# Patient Record
Sex: Female | Born: 1990 | Race: White | Hispanic: No | Marital: Single | Smoking: Never smoker
Health system: Southern US, Community
[De-identification: ages and names within clinical notes are randomized; demographics above are authoritative.]

## PROBLEM LIST (undated history)

## (undated) DIAGNOSIS — E282 Polycystic ovarian syndrome: Secondary | ICD-10-CM

## (undated) HISTORY — DX: Polycystic ovarian syndrome: E28.2

---
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
## 2011-10-17 HISTORY — PX: TONSILLECTOMY: SUR1361

## 2015-07-20 ENCOUNTER — Emergency Department (INDEPENDENT_AMBULATORY_CARE_PROVIDER_SITE_OTHER)
Admission: EM | Admit: 2015-07-20 | Discharge: 2015-07-20 | Disposition: A | Discharge status: Home / Self Care | Payer: BC Managed Care – PPO | Source: Home / Self Care | Attending: Emergency Medicine | Admitting: Emergency Medicine

## 2015-07-20 ENCOUNTER — Encounter (HOSPITAL_COMMUNITY): Payer: Self-pay | Admitting: Emergency Medicine

## 2015-07-20 DIAGNOSIS — S61412A Laceration without foreign body of left hand, initial encounter: Secondary | ICD-10-CM

## 2015-07-20 NOTE — Discharge Instructions (Signed)
Stitches, Staples, or Adhesive Wound Closure For any signs of infection seek medical treatment promptly Health care providers use stitches (sutures), staples, and certain glue (skin adhesives) to hold skin together while it heals (wound closure). You may need this treatment after you have surgery or if you cut your skin accidentally. These methods help your skin to heal more quickly and make it less likely that you will have a scar. A wound may take several months to heal completely. The type of wound you have determines when your wound gets closed. In most cases, the wound is closed as soon as possible (primary skin closure). Sometimes, closure is delayed so the wound can be cleaned and allowed to heal naturally. This reduces the chance of infection. Delayed closure may be needed if your wound:  Is caused by a bite.  Happened more than 6 hours ago.  Involves loss of skin or the tissues under the skin.  Has dirt or debris in it that cannot be removed.  Is infected. WHAT ARE THE DIFFERENT KINDS OF WOUND CLOSURES? There are many options for wound closure. The one that your health care provider uses depends on how deep and how large your wound is. Adhesive Glue############## To use this type of glue to close a wound, your health care provider holds the edges of the wound together and paints the glue on the surface of your skin. You may need more than one layer of glue. Then the wound may be covered with a light bandage (dressing). This type of skin closure may be used for small wounds that are not deep (superficial). Using glue for wound closure is less painful than other methods. It does not require a medicine that numbs the area (local anesthetic). This method also leaves nothing to be removed. Adhesive glue is often used for children and on facial wounds. Adhesive glue cannot be used for wounds that are deep, uneven, or bleeding. It is not used inside of a wound.  Adhesive Strips These strips  are made of sticky (adhesive), porous paper. They are applied across your skin edges like a regular adhesive bandage. You leave them on until they fall off. Adhesive strips may be used to close very superficial wounds. They may also be used along with sutures to improve the closure of your skin edges.  Sutures Sutures are the oldest method of wound closure. Sutures can be made from natural substances, such as silk, or from synthetic materials, such as nylon and steel. They can be made from a material that your body can break down as your wound heals (absorbable), or they can be made from a material that needs to be removed from your skin (nonabsorbable). They come in many different strengths and sizes. Your health care provider attaches the sutures to a steel needle on one end. Sutures can be passed through your skin, or through the tissues beneath your skin. Then they are tied and cut. Your skin edges may be closed in one continuous stitch or in separate stitches. Sutures are strong and can be used for all kinds of wounds. Absorbable sutures may be used to close tissues under the skin. The disadvantage of sutures is that they may cause skin reactions that lead to infection. Nonabsorbable sutures need to be removed. Staples When surgical staples are used to close a wound, the edges of your skin on both sides of the wound are brought close together. A staple is placed across the wound, and an instrument secures the edges  together. Staples are often used to close surgical cuts (incisions). Staples are faster to use than sutures, and they cause less skin reaction. Staples need to be removed using a tool that bends the staples away from your skin. HOW DO I CARE FOR MY WOUND CLOSURE?  Take medicines only as directed by your health care provider.  If you were prescribed an antibiotic medicine for your wound, finish it all even if you start to feel better.  Use ointments or creams only as directed by your  health care provider.  Wash your hands with soap and water before and after touching your wound.  Do not soak your wound in water. Do not take baths, swim, or use a hot tub until your health care provider approves.  Ask your health care provider when you can start showering. Cover your wound if directed by your health care provider.  Do not take out your own sutures or staples.  Do not pick at your wound. Picking can cause an infection.  Keep all follow-up visits as directed by your health care provider. This is important. HOW LONG WILL I HAVE MY WOUND CLOSURE?  Leave adhesive glue on your skin until the glue peels away.  Leave adhesive strips on your skin until the strips fall off.  Absorbable sutures will dissolve within several days.  Nonabsorbable sutures and staples must be removed. The location of the wound will determine how long they stay in. This can range from several days to a couple of weeks. WHEN SHOULD I SEEK HELP FOR MY WOUND CLOSURE? Contact your health care provider if:  You have a fever.  You have chills.  You have drainage, redness, swelling, or pain at your wound.  There is a bad smell coming from your wound.  The skin edges of your wound start to separate after your sutures have been removed.  Your wound becomes thick, raised, and darker in color after your sutures come out (scarring).   This information is not intended to replace advice given to you by your health care provider. Make sure you discuss any questions you have with your health care provider.   Document Released: 06/27/2001 Document Revised: 10/23/2014 Document Reviewed: 03/11/2014 Elsevier Interactive Patient Education [employer]

## 2015-07-20 NOTE — ED Notes (Signed)
Pt reports laceration to left palm onset 1845 while carving pumpkins Bleeding controlled; last tetanus = unknown A&O x4... No acute distress.

## 2015-07-20 NOTE — ED Provider Notes (Signed)
CSN: [id number]     Arrival date & time 07/20/15  1919 History   First MD Initiated Contact with Patient 07/20/15 1940     Chief Complaint  Patient presents with  . Extremity Laceration   (Consider location/radiation/quality/duration/timing/severity/associated sxs/prior Treatment) HPI Comments: 24 year old female was using a knife to carve a pumpkin this evening at 645 when the knife slipped and produced a superficial puncture wound to the palm of the left hand.   History reviewed. No pertinent past medical history. History reviewed. No pertinent past surgical history. No family history on file. Social History  Substance Use Topics  . Smoking status: Never Smoker   . Smokeless tobacco: None  . Alcohol Use: Yes   OB History    No data available     Review of Systems  Constitutional: Negative.   Skin: Positive for wound.  All other systems reviewed and are negative.   Allergies  Review of patient's allergies indicates no known allergies.  Home Medications   Prior to Admission medications   Not on File   Meds Ordered and Administered this Visit  Medications - No data to display  BP 125/77 mmHg  Pulse 82  Temp(Src) 99 F (37.2 C) (Oral)  Resp 16  SpO2 100%  LMP 07/01/2015 No data found.   Physical Exam  Constitutional: She is oriented to person, place, and time. She appears well-developed and well-nourished. No distress.  Eyes: EOM are normal.  Neck: Normal range of motion. Neck supple.  Cardiovascular: Normal rate.   Pulmonary/Chest: Effort normal. No respiratory distress.  Musculoskeletal: She exhibits no edema.  Neurological: She is alert and oriented to person, place, and time. She exhibits normal muscle tone.  Skin: Skin is warm and dry.  4 mm puncture wound/superficial laceration involving the dermis only to the palm of the left hand near the interdigital webspace between the thumb and index finger. No current bleeding.  Psychiatric: She has a normal  mood and affect.  Nursing note and vitals reviewed.   ED Course  LACERATION REPAIR Date/Time: 07/20/2015 8:16 PM Performed by: Phineas Real, Harshan Kearley Authorized by: Charm Rings Consent: Verbal consent obtained. Risks and benefits: risks, benefits and alternatives were discussed Consent given by: patient Patient understanding: patient states understanding of the procedure being performed Patient identity confirmed: verbally with patient Body area: upper extremity Location details: left hand Laceration length: 0.4 cm Foreign bodies: no foreign bodies Tendon involvement: none Nerve involvement: none Vascular damage: no Patient sedated: no Irrigation solution: saline Irrigation method: jet lavage Amount of cleaning: standard Debridement: none Degree of undermining: none Skin closure: glue Technique: simple Approximation: close Approximation difficulty: simple Patient tolerance: Patient tolerated the procedure well with no immediate complications   (including critical care time)  Labs Review Labs Reviewed - No data to display  Imaging Review No results found.   Visual Acuity Review  Right Eye Distance:   Left Eye Distance:   Bilateral Distance:    Right Eye Near:   Left Eye Near:    Bilateral Near:         MDM   1. Laceration of left palm without complication, initial encounter    Covered superficial laceration with Dermabond. Instructions for care and infection symptoms discuss with patient and given written instructions.    Hayden Rasmussen, NP 07/20/15 2017

## 2018-03-14 ENCOUNTER — Encounter: Payer: Self-pay | Admitting: Physician Assistant

## 2018-03-14 ENCOUNTER — Ambulatory Visit (INDEPENDENT_AMBULATORY_CARE_PROVIDER_SITE_OTHER): Payer: Self-pay | Admitting: Physician Assistant

## 2018-03-14 ENCOUNTER — Ambulatory Visit: Payer: Self-pay

## 2018-03-14 VITALS — BP 112/72 | HR 83 | Temp 98.1°F | Resp 16 | Ht 70.0 in | Wt 163.0 lb

## 2018-03-14 DIAGNOSIS — R42 Dizziness and giddiness: Secondary | ICD-10-CM

## 2018-03-14 MED ORDER — MECLIZINE HCL 25 MG PO TABS: 25.0000 mg | ORAL_TABLET | Freq: Three times a day (TID) | ORAL | 0 refills | Status: AC | PRN
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
## 2018-03-14 NOTE — Patient Instructions (Addendum)
Vertigo Vertigo is the feeling that you or your surroundings are moving when they are not. Vertigo can be dangerous if it occurs while you are doing something that could endanger you or others, such as driving. What are the causes? This condition is caused by a disturbance in the signals that are sent by your body's sensory systems to your brain. Different causes of a disturbance can lead to vertigo, including:  Infections, especially in the inner ear.  A bad reaction to a drug, or misuse of alcohol and medicines.  Withdrawal from drugs or alcohol.  Quickly changing positions, as when lying down or rolling over in bed.  Migraine headaches.  Decreased blood flow to the brain.  Decreased blood pressure.  Increased pressure in the brain from a head or neck injury, stroke, infection, tumor, or bleeding.  Central nervous system disorders.  What are the signs or symptoms? Symptoms of this condition usually occur when you move your head or your eyes in different directions. Symptoms may start suddenly, and they usually last for less than a minute. Symptoms may include:  Loss of balance and falling.  Feeling like you are spinning or moving.  Feeling like your surroundings are spinning or moving.  Nausea and vomiting.  Blurred vision or double vision.  Difficulty hearing.  Slurred speech.  Dizziness.  Involuntary eye movement (nystagmus).  Symptoms can be mild and cause only slight annoyance, or they can be severe and interfere with daily life. Episodes of vertigo may return (recur) over time, and they are often triggered by certain movements. Symptoms may improve over time. How is this diagnosed? This condition may be diagnosed based on medical history and the quality of your nystagmus. Your health care provider may test your eye movements by asking you to quickly change positions to trigger the nystagmus. This may be called the Dix-Hallpike test, head thrust test, or roll  test. You may be referred to a health care provider who specializes in ear, nose, and throat (ENT) problems (otolaryngologist) or a provider who specializes in disorders of the central nervous system (neurologist). You may have additional testing, including:  A physical exam.  Blood tests.  MRI.  A CT scan.  An electrocardiogram (ECG). This records electrical activity in your heart.  An electroencephalogram (EEG). This records electrical activity in your brain.  Hearing tests.  How is this treated? Treatment for this condition depends on the cause and the severity of the symptoms. Treatment options include:  Medicines to treat nausea or vertigo. These are usually used for severe cases. Some medicines that are used to treat other conditions may also reduce or eliminate vertigo symptoms. These include: ? Medicines that control allergies (antihistamines). ? Medicines that control seizures (anticonvulsants). ? Medicines that relieve depression (antidepressants). ? Medicines that relieve anxiety (sedatives).  Head movements to adjust your inner ear back to normal. If your vertigo is caused by an ear problem, your health care provider may recommend certain movements to correct the problem.  Surgery. This is rare.  Follow these instructions at home: Safety  Move slowly.Avoid sudden body or head movements.  Avoid driving.  Avoid operating heavy machinery.  Avoid doing any tasks that would cause danger to you or others if you would have a vertigo episode during the task.  If you have trouble walking or keeping your balance, try using a cane for stability. If you feel dizzy or unstable, sit down right away.  Return to your normal activities as told by your  health care provider. Ask your health care provider what activities are safe for you. General instructions  Take over-the-counter and prescription medicines only as told by your health care provider.  Avoid certain positions  or movements as told by your health care provider.  Drink enough fluid to keep your urine clear or pale yellow.  Keep all follow-up visits as told by your health care provider. This is important. Contact a health care provider if:  Your medicines do not relieve your vertigo or they make it worse.  You have a fever.  Your condition gets worse or you develop new symptoms.  Your family or friends notice any behavioral changes.  Your nausea or vomiting gets worse.  You have numbness or a "pins and needles" sensation in part of your body. Get help right away if:  You have difficulty moving or speaking.  You are always dizzy.  You faint.  You develop severe headaches.  You have weakness in your hands, arms, or legs.  You have changes in your hearing or vision.  You develop a stiff neck.  You develop sensitivity to light. This information is not intended to replace advice given to you by your health care provider. Make sure you discuss any questions you have with your health care provider. Document Released: 07/12/2005 Document Revised: 03/15/2016 Document Reviewed: 01/25/2015 Elsevier Interactive Patient Education  2018 [employer].    IF you received an x-ray today, you will receive an invoice from [facility]. Please contact [facility] at [phone] with questions or concerns regarding your invoice.   IF you received labwork today, you will receive an invoice from [place]. Please contact [facility] at [phone] with questions or concerns regarding your invoice.   Our billing staff will not be able to assist you with questions regarding bills from these companies.  You will be contacted with the lab results as soon as they are available. The fastest way to get your results is to activate your My Chart account. Instructions are located on the last page of this paperwork. If you have not heard from [place] regarding the results in 2 weeks, please contact  this office.

## 2018-03-14 NOTE — Telephone Encounter (Signed)
Patient called in with c/o "dizziness." She says "the first time it happened was Monday night before bed. I was sitting in a chair and the room started spinning, then I had a headache. I went to bed and woke up fine. It happened again Tuesday night while sitting and again this morning while sitting. Afterwards I feel a little weak when I stand and a little unsteady, but able to walk around without holding on." I asked about other symptoms, she says "headache afterwards is the only thing." According to protocol, see PCP within 24 hours, appointment scheduled for today at 1040 with Benny Lennert, PA-C, care advice given, patient verbalized understanding.   Reason for Disposition . [1] MODERATE dizziness (e.g., vertigo; feels very unsteady, interferes with normal activities) AND [2] has NOT been evaluated by physician for this  Answer Assessment - Initial Assessment Questions 1. DESCRIPTION: "Describe your dizziness."     Room spinning and dizzy with headache 2. VERTIGO: "Do you feel like either you or the room is spinning or tilting?"      Spinning 3. LIGHTHEADED: "Do you feel lightheaded?" (e.g., somewhat faint, woozy, weak upon standing)     A little weak 4. SEVERITY: "How bad is it?"  "Can you walk?"   - MILD - Feels unsteady but walking normally.   - MODERATE - Feels very unsteady when walking, but not falling; interferes with normal activities (e.g., school, work) .   - SEVERE - Unable to walk without falling (requires assistance).     Mild 5. ONSET:  "When did the dizziness begin?"     Monday night, Tuesday night both near bedtime, then this morning 6. AGGRAVATING FACTORS: "Does anything make it worse?" (e.g., standing, change in head position)     Looking at my phone 7. CAUSE: "What do you think is causing the dizziness?"     I don't know 8. RECURRENT SYMPTOM: "Have you had dizziness before?" If so, ask: "When was the last time?" "What happened that time?"     No 9. OTHER SYMPTOMS: "Do  you have any other symptoms?" (e.g., headache, weakness, numbness, vomiting, earache)     Headache after the room spins 10. PREGNANCY: "Is there any chance you are pregnant?" "When was your last menstrual period?"       No-LMP end of April  Protocols used: DIZZINESS - VERTIGO-A-AH

## 2018-05-10 ENCOUNTER — Other Ambulatory Visit: Payer: Self-pay

## 2018-05-10 ENCOUNTER — Encounter: Payer: Self-pay | Admitting: Physician Assistant

## 2018-05-10 ENCOUNTER — Ambulatory Visit: Payer: BLUE CROSS/BLUE SHIELD | Admitting: Physician Assistant

## 2018-05-10 VITALS — BP 98/60 | HR 83 | Temp 99.2°F | Resp 18 | Ht 70.0 in | Wt 163.0 lb

## 2018-05-10 DIAGNOSIS — R42 Dizziness and giddiness: Secondary | ICD-10-CM | POA: Diagnosis not present

## 2018-05-10 DIAGNOSIS — J0110 Acute frontal sinusitis, unspecified: Secondary | ICD-10-CM | POA: Diagnosis not present

## 2018-05-10 MED ORDER — AMOXICILLIN 875 MG PO TABS: 875.0000 mg | ORAL_TABLET | Freq: Two times a day (BID) | ORAL | 0 refills | Status: AC

## 2018-05-10 MED ORDER — FLUCONAZOLE 150 MG PO TABS: 150.0000 mg | ORAL_TABLET | Freq: Once | ORAL | 0 refills | Status: AC

## 2018-05-10 MED ORDER — FLUTICASONE PROPIONATE 50 MCG/ACT NA SUSP: 2.0000 | Freq: Every day | NASAL | 6 refills | Status: AC

## 2018-05-10 MED ORDER — GUAIFENESIN ER 1200 MG PO TB12: 1.0000 | ORAL_TABLET | Freq: Two times a day (BID) | ORAL | 0 refills | Status: AC

## 2018-05-10 NOTE — Patient Instructions (Addendum)
Stay good and hydrated.  Eat regularly. Use flonase for 2 weeks after symptoms resolve.  Finish all antibiotics.     IF you received an x-ray today, you will receive an invoice from [facility]. Please contact [facility] at [phone] with questions or concerns regarding your invoice.   IF you received labwork today, you will receive an invoice from [facility]. Please contact [facility] at [phone] with questions or concerns regarding your invoice.   Our billing staff will not be able to assist you with questions regarding bills from these companies.  You will be contacted with the lab results as soon as they are available. The fastest way to get your results is to activate your My Chart account. Instructions are located on the last page of this paperwork. If you have not heard from us regarding the results in 2 weeks, please contact this office.

## 2018-05-10 NOTE — Progress Notes (Signed)
Suzanne Harrell  MRN: [id number] DOB: 1991-01-15  PCP: Patient, No Pcp Per  Chief Complaint  Patient presents with  . Dizziness    follow up     Subjective:  Pt presents to clinic for recheck of her dizziness.  She is not longer having vertigo but when she turns her head she knows her head moves but her eyes take a while to "catch up"  She feels like it is there all the time but some times are worse than others - it happens with turning her head to either side but it may be worse when turning the head to the left.    She is having daily headaches - dull headache in either the temple area or frontal sinus area. She is unsure how the headaches are related to the dizzy.  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   Screen work makes the symptoms worse - she has blue glasses that she uses in the office and that seems to help with sluggish feeling that her eyes are not catching up - she does not feel like this is focus area.    Still drinking water well and still doing low gluten.   No tinnitus, some mild ear aching, no hearing problems.  She is having some sinus congestion  She started flonase and that has helped - she defintiely notices it more whens he forgets to use th e flonase  History is obtained by patient.  Review of Systems  Constitutional: Negative for chills and fever.  HENT: Positive for sinus pressure and sinus pain.   Respiratory: Negative for cough.   Gastrointestinal: Negative for nausea.  Neurological: Positive for dizziness and headaches. Negative for light-headedness.    There are no active problems to display for this patient.   Current Outpatient Medications on File Prior to Visit  Medication Sig Dispense Refill  . meclizine (ANTIVERT) 25 MG tablet Take 1 tablet (25 mg total) by mouth 3 (three) times daily as needed for dizziness. 30 tablet 0   No current facility-administered medications on file prior to visit.     No Known  Allergies  No past medical history on file. 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   Social History   Tobacco Use  . Smoking status: Never Smoker  . Smokeless tobacco: Never Used  Substance Use Topics  . Alcohol use: Yes  . Drug use: No   family history includes Meniere's disease in her maternal aunt and maternal grandmother.     Objective:  BP 98/60   Pulse 83   Temp 99.2 F (37.3 C) (Oral)   Resp 18   Ht 5\' 10"  (1.778 m)   Wt 163 lb (73.9 kg)   LMP 05/08/2018   SpO2 100%   BMI 23.39 kg/m  Body mass index is 23.39 kg/m.  Wt Readings from Last 3 Encounters:  05/10/18 163 lb (73.9 kg)  03/14/18 163 lb (73.9 kg)    Physical Exam  Constitutional: She is oriented to person, place, and time. She appears well-developed and well-nourished.  HENT:  Head: Normocephalic and atraumatic.  Right Ear: Hearing, tympanic membrane, external ear and ear canal normal.  Left Ear: Hearing, tympanic membrane, external ear and ear canal normal.  Nose: Right sinus exhibits maxillary sinus tenderness and frontal sinus tenderness. Left sinus exhibits maxillary sinus tenderness and frontal sinus tenderness.  Mouth/Throat: Uvula is midline, oropharynx is clear and  moist and mucous membranes are normal.  Eyes: Conjunctivae are normal.  Neck: Normal range of motion.  Cardiovascular: Normal rate, regular rhythm and normal heart sounds.  No murmur heard. Pulmonary/Chest: Effort normal and breath sounds normal. She has no wheezes.  Lymphadenopathy:       Head (right side): No tonsillar, no preauricular, no posterior auricular and no occipital adenopathy present.       Head (left side): No tonsillar, no preauricular, no posterior auricular and no occipital adenopathy present.    She has no cervical adenopathy.       Right: No supraclavicular adenopathy present.       Left: No supraclavicular adenopathy present.  Neurological: She is alert and  oriented to person, place, and time. She has normal strength and normal reflexes. No cranial nerve deficit or sensory deficit.  Skin: Skin is warm and dry.  Psychiatric: She has a normal mood and affect. Her behavior is normal. Judgment and thought content normal.  Vitals reviewed.   Assessment and Plan :  Acute non-recurrent frontal sinusitis - Plan: amoxicillin (AMOXIL) 875 MG tablet, Guaifenesin (MUCINEX MAXIMUM STRENGTH) 1200 MG TB12, fluticasone (FLONASE) 50 MCG/ACT nasal spray, fluconazole (DIFLUCAN) 150 MG tablet  Dizzy  Vertigo has resolved. Suspect sinusitis being because of daily recurrent headaches and slight dizziness.  Treat and see what happens over the next 2 weeks.  Continue Flonase as this has helped her symptoms over the last 2 weeks.  Patient verbalized to me that they understand the following: diagnosis, what is being done for them, what to expect and what should be done at home.  Their questions have been answered.  See after visit summary for patient specific instructions.  Benny LennertSarah Weber PA-C  [facility] 05/14/2018 7:39 AM  Please note: Portions of this report may have been transcribed using dragon voice recognition software. Every effort was made to ensure accuracy; however, inadvertent computerized transcription errors may be present.

## 2018-05-14 ENCOUNTER — Encounter: Payer: Self-pay | Admitting: Physician Assistant

## 2018-06-06 ENCOUNTER — Ambulatory Visit (INDEPENDENT_AMBULATORY_CARE_PROVIDER_SITE_OTHER): Payer: BLUE CROSS/BLUE SHIELD | Admitting: Gastroenterology

## 2018-06-06 ENCOUNTER — Other Ambulatory Visit (INDEPENDENT_AMBULATORY_CARE_PROVIDER_SITE_OTHER): Payer: BLUE CROSS/BLUE SHIELD

## 2018-06-06 ENCOUNTER — Encounter: Payer: Self-pay | Admitting: Gastroenterology

## 2018-06-06 VITALS — BP 108/70 | HR 78 | Ht 68.0 in | Wt 163.2 lb

## 2018-06-06 DIAGNOSIS — R109 Unspecified abdominal pain: Secondary | ICD-10-CM | POA: Diagnosis not present

## 2018-06-06 LAB — CBC WITH DIFFERENTIAL/PLATELET
Basophils Absolute: 0 10*3/uL (ref 0.0–0.1)
Basophils Relative: 0.3 % (ref 0.0–3.0)
EOS PCT: 1.1 % (ref 0.0–5.0)
Eosinophils Absolute: 0.1 10*3/uL (ref 0.0–0.7)
HCT: 39.9 % (ref 36.0–46.0)
HEMOGLOBIN: 13.3 g/dL (ref 12.0–15.0)
LYMPHS PCT: 26.6 % (ref 12.0–46.0)
Lymphs Abs: 1.4 10*3/uL (ref 0.7–4.0)
MCHC: 33.3 g/dL (ref 30.0–36.0)
MCV: 88.5 fl (ref 78.0–100.0)
MONO ABS: 0.5 10*3/uL (ref 0.1–1.0)
Monocytes Relative: 9.7 % (ref 3.0–12.0)
Neutro Abs: 3.3 10*3/uL (ref 1.4–7.7)
Neutrophils Relative %: 62.3 % (ref 43.0–77.0)
Platelets: 149 10*3/uL — ABNORMAL LOW (ref 150.0–400.0)
RBC: 4.51 Mil/uL (ref 3.87–5.11)
RDW: 13.3 % (ref 11.5–15.5)
WBC: 5.2 10*3/uL (ref 4.0–10.5)

## 2018-06-06 MED ORDER — DICYCLOMINE HCL 10 MG PO CAPS: 10.0000 mg | ORAL_CAPSULE | Freq: Four times a day (QID) | ORAL | 11 refills | Status: AC | PRN

## 2018-06-06 NOTE — Progress Notes (Signed)
Chief Complaint: Abdominal pain   Referring Provider:     Self    HPI:     Suzanne RilyBetsy Harrell is a 27 y.o. female referred to the [facility] for evaluation of abdominal pain.   She states she has a long hx of "stomach issues" which have worsened over the last several months. Sxs worse post prandial, but can occur anytime of day. Has decreased gluten and dairy and exercises regularly, without any notable difference in sxs, but states that she overall feels well with that dietary plan. Pain is typically either LUQ or b/l LQ. Described as cramping pain, lasting 1 hour. Occurs typicaly at least once/day. Sxs worse when traveling/dietary indiscretions. No changes in bowel habits. Some improvement in pain with BM, but not reliable. Nausea with sxs, but can also occur independent of pain. No emesis. Nausea will last through day when it occurs. Less common, approx 3-4 days/week, occurring sporadically and no related to meals.    She endorses post prandial fecal urgency, but no history of fecal incontinence, fecal seepage, or accidents.    Recently completed Abx of sinus infection. Otherwise, has not taken any medications for her GI sxs. Patient otherwise denies , diarrhea, constipation, hematochezia, melena, night sweats, fever, chills, weight loss, early satiety, dysphagia, odynophagia.  No known family history of CRC, GI malignancy, liver disease, pancreatic disease, or IBD.    History reviewed. No pertinent past medical history.   Past Surgical History:  Procedure Laterality Date  . TONSILLECTOMY  2013   Family History  Problem Relation Age of Onset  . Meniere's disease Maternal Grandmother   . Meniere's disease Maternal Aunt   . Colon cancer Neg Hx   . Esophageal cancer Neg Hx    Social History   Tobacco Use  . Smoking status: Never Smoker  . Smokeless tobacco: Never Used  Substance Use Topics  . Alcohol use: Yes  . Drug use: No   Current Outpatient  Medications  Medication Sig Dispense Refill  . fluticasone (FLONASE) 50 MCG/ACT nasal spray Place 2 sprays into both nostrils daily. 16 g 6  . meclizine (ANTIVERT) 25 MG tablet Take 1 tablet (25 mg total) by mouth 3 (three) times daily as needed for dizziness. 30 tablet 0   No current facility-administered medications for this visit.    No Known Allergies   Review of Systems: All systems reviewed and negative except where noted in HPI.     Physical Exam:    Wt Readings from Last 3 Encounters:  06/06/18 163 lb 4 oz (74 kg)  05/10/18 163 lb (73.9 kg)  03/14/18 163 lb (73.9 kg)    BP 108/70   Pulse 78   Ht 5\' 8"  (1.727 m)   Wt 163 lb 4 oz (74 kg)   LMP 05/08/2018   BMI 24.82 kg/m  Constitutional:  Pleasant, in no acute distress. Psychiatric: Normal mood and affect. Behavior is normal. EENT: Pupils normal.  Conjunctivae are normal. No scleral icterus. Neck supple. No cervical LAD. Cardiovascular: Normal rate, regular rhythm. No edema Pulmonary/chest: Effort normal and breath sounds normal. No wheezing, rales or rhonchi. Abdominal: Very mild TTP in bilateral lower quadrants.  No rebound, no guarding.  Tenderness is nonfocal.  No peritoneal signs.  Otherwise soft, nondistended, positive bowel sounds active throughout.  There are no masses palpable. No hepatomegaly. Neurological: Alert and oriented to person place and time. Skin: Skin is warm and dry.  No rashes noted.    ASSESSMENT AND PLAN;   Suzanne Harrell is a 27 y.o. female presenting with a long-standing history of abdominal pain.  1) Abdominal pain: Discussed DDX for chronic intermittent abdominal pain at length, to include functional, celiac, IBD, vascular, PUD, H. pylori, etc.  Constellation of sxs seem most c/w functional abdominal pain will evaluate and treat as below.   - low FODMAP - Trial Bentyl - check Celiac Panel and CBC - If the above labs are normal, and no improvement with a trial of Bentyl and dietary  modifications, or if change in symptoms, can consider further evaluation with upper endoscopy or cross-sectional imaging.  Similarly, can trial course of PPI or FD guard along with H. pylori serology.   Mystery Schrupp V Danilyn Cocke, DO, FACG  06/06/2018, 9:14 AM   No ref. provider found

## 2018-06-06 NOTE — Patient Instructions (Addendum)
If you are age 27 or older, your body mass index should be between 23-30. Your Body mass index is 24.82 kg/m. If this is out of the aforementioned range listed, please consider follow up with your Primary Care Provider.  If you are age 27 or younger, your body mass index should be between 19-25. Your Body mass index is 24.82 kg/m. If this is out of the aformentioned range listed, please consider follow up with your Primary Care Provider.   Please go to the lab on the 2nd floor [address] before you leave the office today.    Low FODMAP Diet: (Fermentable Oligosaccharides, Disaccharides, Monosaccharides, and Polyols) These are short chain carbohydrates and sugar alcohols that are poorly absorbed by the body, resulting in multiple abdominal symptoms, including changes in bowel habits, abdominal pain/discomfort, bloating, abdominal distension, gas, etc.

## 2018-06-10 LAB — RETICULIN ANTIBODIES, IGA W TITER: Reticulin IGA Screen: NEGATIVE

## 2018-06-10 LAB — GLIADIN ANTIBODIES, SERUM
GLIADIN IGG: 2 U
Gliadin IgA: 4 Units

## 2018-06-10 LAB — TISSUE TRANSGLUTAMINASE, IGA: (tTG) Ab, IgA: 1 U/mL

## 2018-09-03 DIAGNOSIS — H5213 Myopia, bilateral: Secondary | ICD-10-CM | POA: Diagnosis not present

## 2018-11-14 DIAGNOSIS — R0981 Nasal congestion: Secondary | ICD-10-CM | POA: Diagnosis not present

## 2018-11-14 DIAGNOSIS — J019 Acute sinusitis, unspecified: Secondary | ICD-10-CM | POA: Diagnosis not present

## 2019-04-14 DIAGNOSIS — Z20828 Contact with and (suspected) exposure to other viral communicable diseases: Secondary | ICD-10-CM | POA: Diagnosis not present
